# Patient Record
Sex: Female | Born: 1975 | Race: Asian | Hispanic: No | Marital: Married | Smoking: Never smoker
Health system: Southern US, Community
[De-identification: ages and names within clinical notes are randomized; demographics above are authoritative.]

---

## 2005-10-11 ENCOUNTER — Ambulatory Visit: Payer: Self-pay | Admitting: Internal Medicine

## 2006-10-29 ENCOUNTER — Inpatient Hospital Stay (HOSPITAL_COMMUNITY): Admission: AD | Admit: 2006-10-29 | Discharge: 2006-11-03 | Payer: Self-pay | Admitting: Obstetrics & Gynecology

## 2006-11-21 ENCOUNTER — Inpatient Hospital Stay (HOSPITAL_COMMUNITY): Admission: RE | Admit: 2006-11-21 | Discharge: 2006-11-24 | Payer: Self-pay | Admitting: Obstetrics and Gynecology

## 2006-11-21 ENCOUNTER — Encounter (INDEPENDENT_AMBULATORY_CARE_PROVIDER_SITE_OTHER): Payer: Self-pay | Admitting: Obstetrics and Gynecology

## 2007-06-08 ENCOUNTER — Encounter (INDEPENDENT_AMBULATORY_CARE_PROVIDER_SITE_OTHER): Payer: Self-pay | Admitting: Obstetrics and Gynecology

## 2007-06-08 ENCOUNTER — Ambulatory Visit (HOSPITAL_COMMUNITY): Admission: RE | Admit: 2007-06-08 | Discharge: 2007-06-08 | Payer: Self-pay | Admitting: Obstetrics and Gynecology

## 2007-11-30 ENCOUNTER — Ambulatory Visit: Payer: Self-pay | Admitting: Internal Medicine

## 2007-11-30 DIAGNOSIS — M546 Pain in thoracic spine: Secondary | ICD-10-CM | POA: Insufficient documentation

## 2007-12-04 ENCOUNTER — Telehealth (INDEPENDENT_AMBULATORY_CARE_PROVIDER_SITE_OTHER): Payer: Self-pay | Admitting: *Deleted

## 2008-09-23 IMAGING — US US OB COMP +14 WK
1 series · 14 of 28 positions shown · non-contrast
Comparison: none

OBSTETRICAL ULTRASOUND:

 This ultrasound exam was performed in the [HOSPITAL] Ultrasound Department.  The OB US report was generated in the AS system, and faxed to the ordering physician.  This report is also available in [REDACTED] PACS.

[Series 1: us ob comp +14 wk · 14 of 44 slices shown]
[im 2/44]
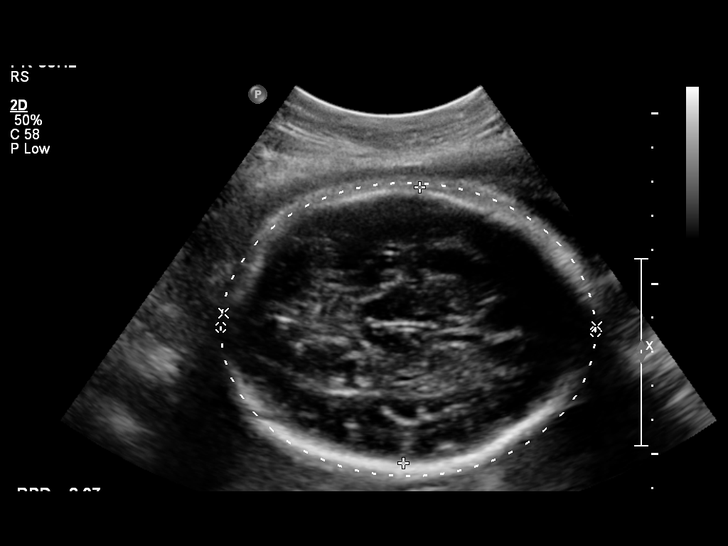
[im 5/44]
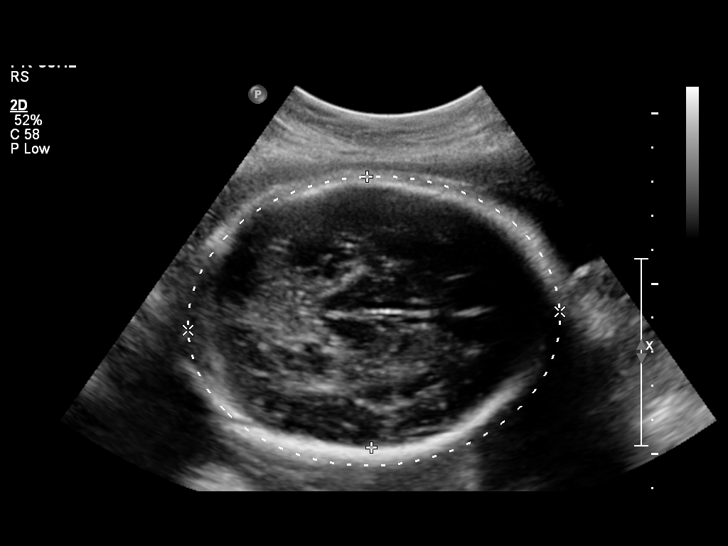
[im 8/44]
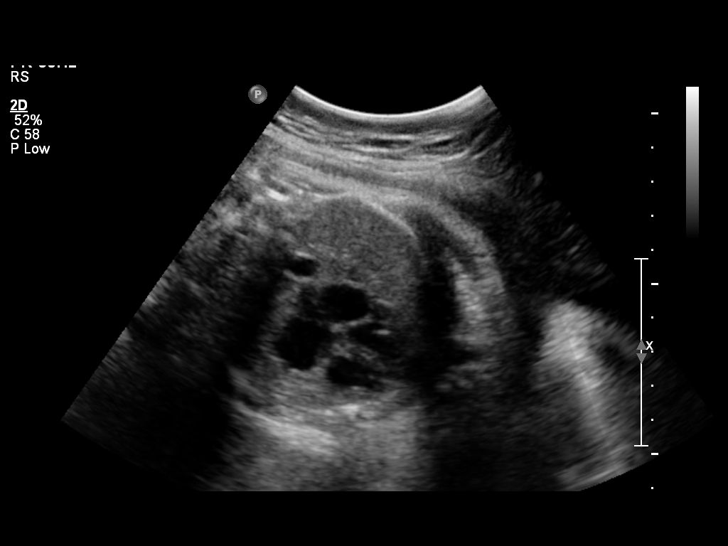
[im 12/44]
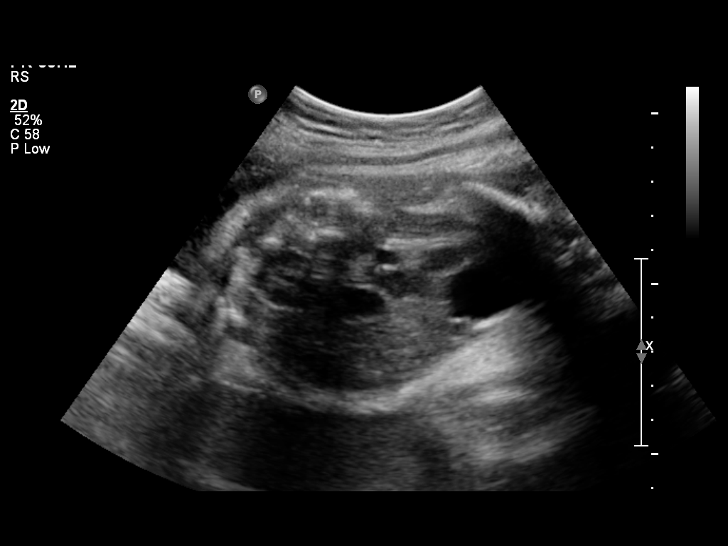
[im 15/44]
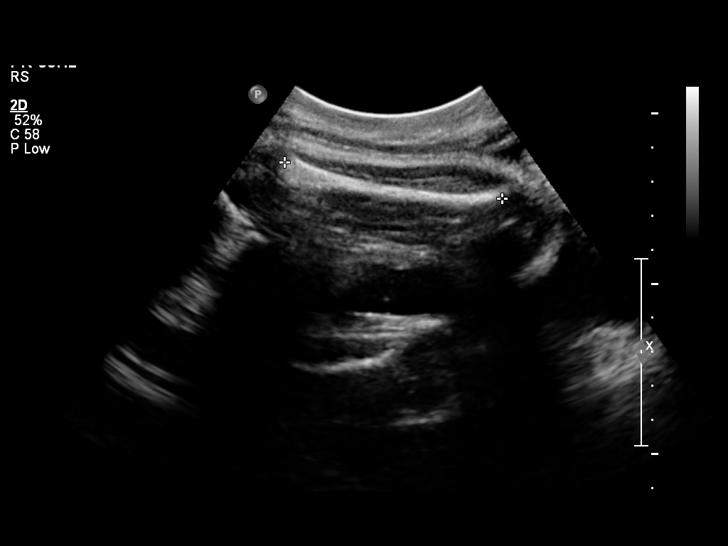
[im 18/44]
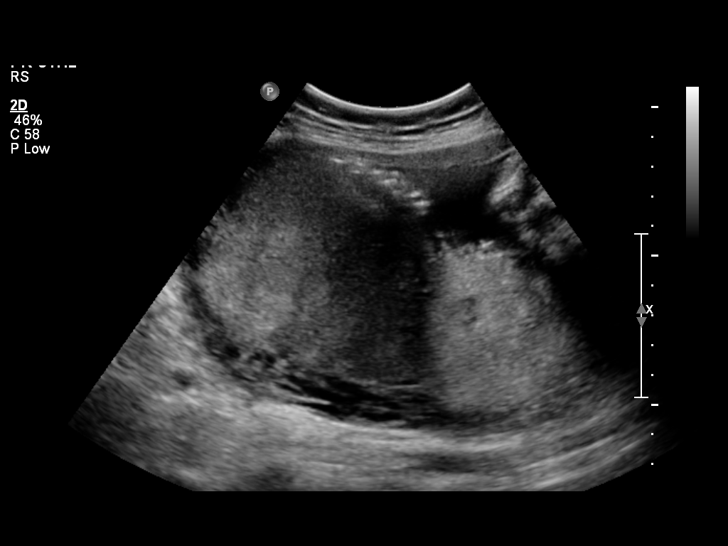
[im 21/44]
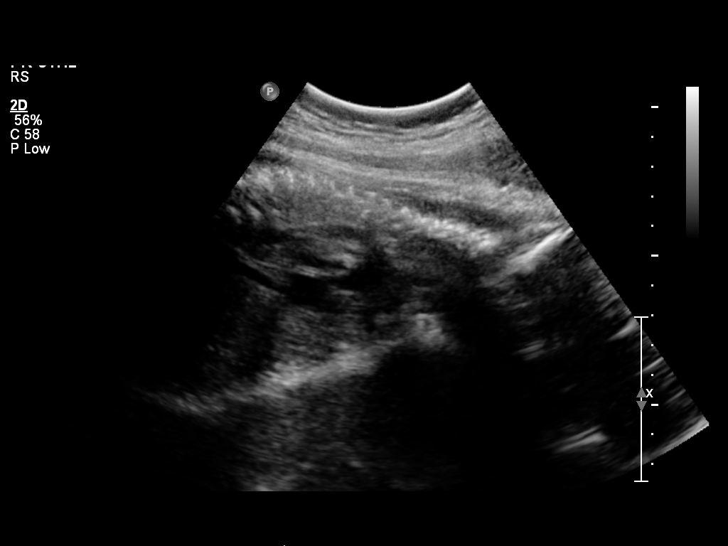
[im 24/44]
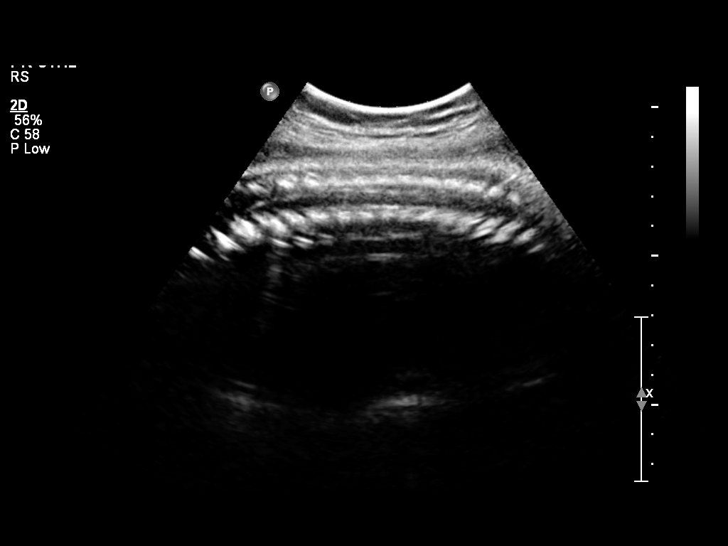
[im 28/44]
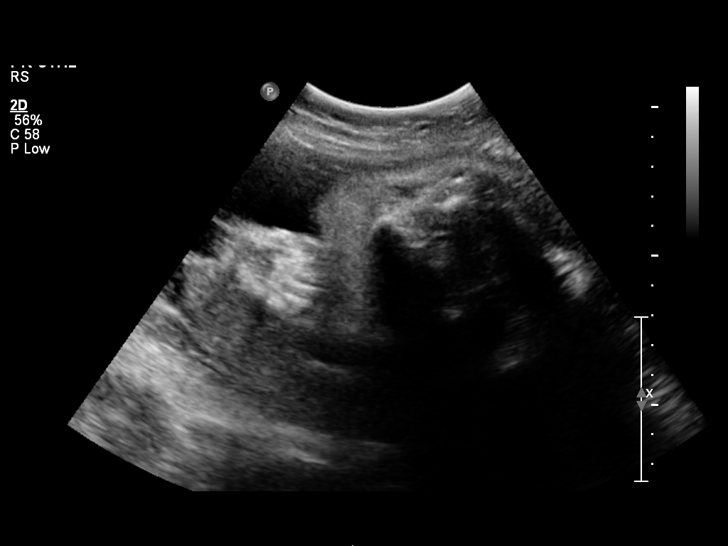
[im 31/44]
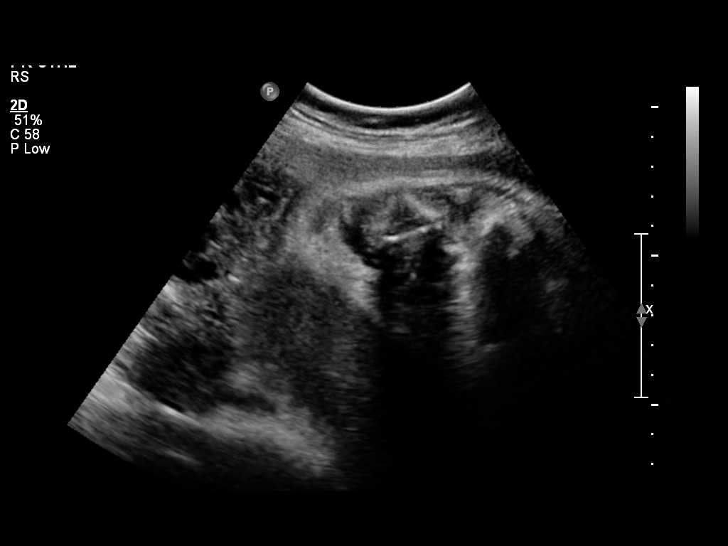
[im 34/44]
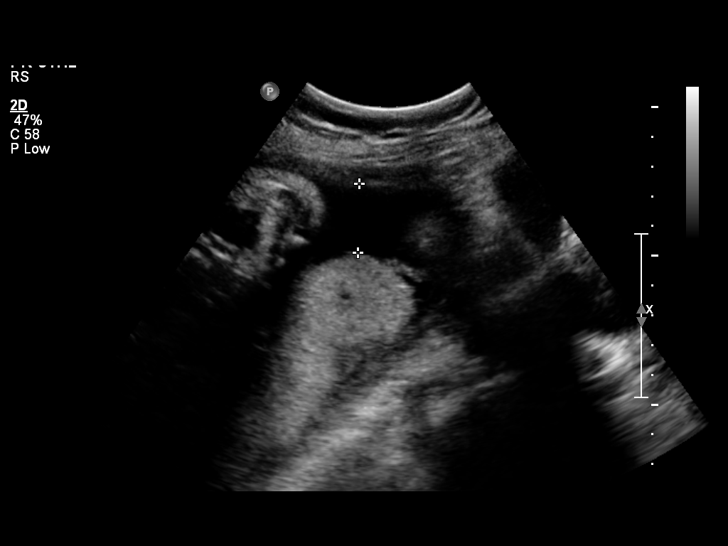
[im 37/44]
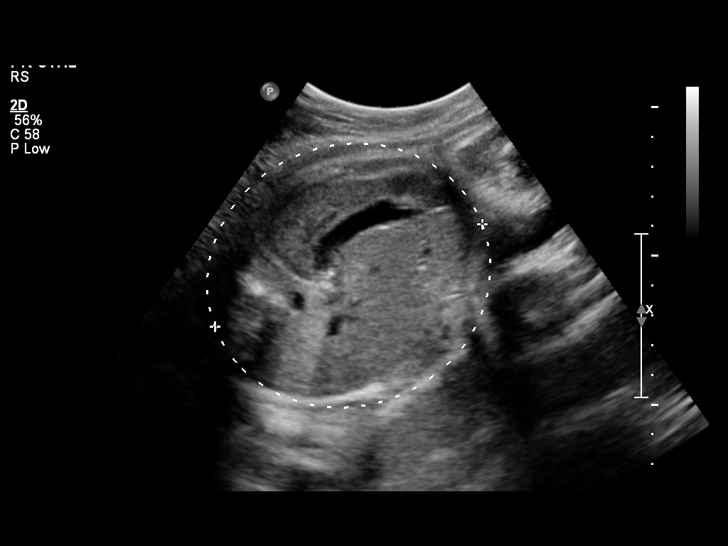
[im 40/44]
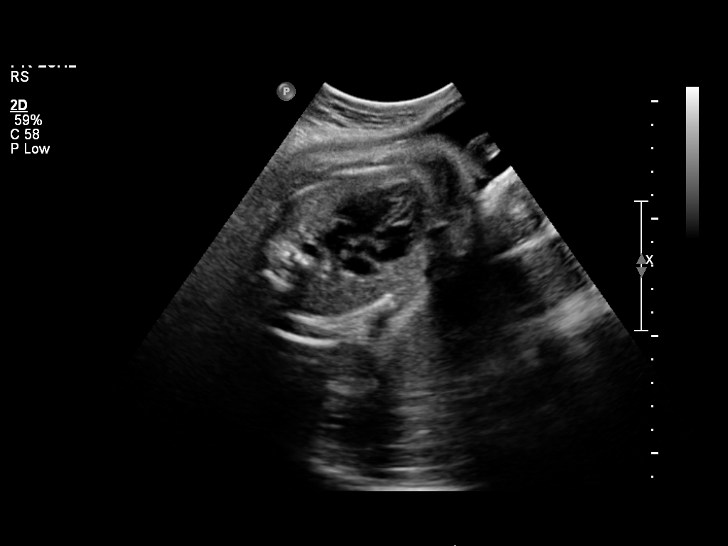
[im 44/44]
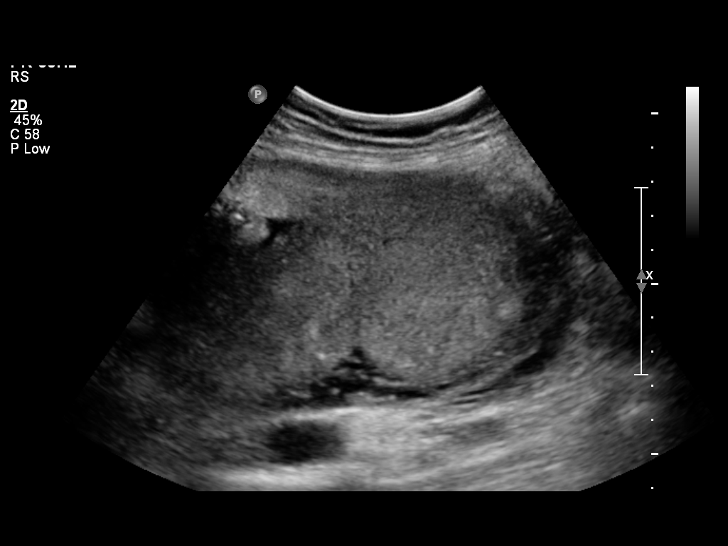

[14 of 28 positions shown; findings below may reference images not displayed]

IMPRESSION: See AS Obstetric US report.

## 2008-10-30 ENCOUNTER — Encounter: Admission: RE | Admit: 2008-10-30 | Discharge: 2008-10-30 | Payer: Self-pay | Admitting: Neurology

## 2009-06-04 ENCOUNTER — Encounter: Admission: RE | Admit: 2009-06-04 | Discharge: 2009-06-04 | Payer: Self-pay | Admitting: Internal Medicine

## 2010-08-31 NOTE — Op Note (Signed)
Julie Pineda, Julie Pineda               ACCOUNT NO.:  [id number]   MEDICAL RECORD NO.:  [id number]          PATIENT TYPE:  INP   LOCATION:  9124                          [facility]   PHYSICIAN:  Lenoard Aden, M.D.DATE OF BIRTH:  [birth year]   DATE OF PROCEDURE:  11/21/2006  DATE OF DISCHARGE:  11/03/2006                               OPERATIVE REPORT   PREOPERATIVE DIAGNOSIS:  37 week intrauterine pregnancy, third trimester  bleeding with chronic abruptio, status post betamethasone, previous  cesarean section for repeat.   POSTOPERATIVE DIAGNOSIS:  37 week intrauterine pregnancy, third  trimester bleeding with chronic abruptio, status post betamethasone,  previous cesarean section for repeat.   PROCEDURE:  Repeat low segment transverse cesarean section.   SURGEON:  Lenoard Aden, M.D.   ASSISTANT:  Maxie Better, M.D.   ANESTHESIA:  Spinal by Dr. Pamalee Leyden.   ESTIMATED BLOOD LOSS:  1000 mL.   COMPLICATIONS:  None.   DRAINS:  Foley.   COUNTS:  Correct.   DISPOSITION:  Patient to recovery in good condition.   BRIEF OPERATIVE NOTE:  After being apprised of the risks of anesthesia,  infection, bleeding, intra-abdominal injury with need for repair,  possible risks of prematurity, the patient was brought to the operating.  She was administered a spinal anesthetic without complications.  She was  prepped and draped in the usual sterile fashion.  A Foley catheter was  placed. After achieving anesthesia with dilute Marcaine solution placed,  a Pfannenstiel skin incision was made with the scalpel and carried down  to the fascia which was nicked in the midline using Mayo scissors.  The  rectus muscle was dissected sharply in the midline.  The peritoneum was  entered sharply and bladder blade placed.  The visceral peritoneum was  scored sharply off the uterine segment.  A Kerr hysterotomy incision  made.  Atraumatic delivery full term living female, handed to  pediatricians in attendance, Apgars 8/9.  Cord blood collected.  Placenta delivered manually intact from a posterior location.  No  evidence of placental abruption noted.  Good hemostasis was noted.  The  uterus was curetted using a dry lap pack and closed in two running  imbricating layers of 0 Monocryl suture.  The bladder flap was inspected  and found to be hemostatic.  Irrigation accomplished.  Good hemostasis achieved.  The fascia was then closed using a 0 Monocryl  in a continuous running fashion.  The skin was closed using staples.  The patient tolerated the procedure well and was transferred to the  recovery room in good condition.      Lenoard Aden, M.D.  Electronically Signed     RJT/MEDQ  D:  11/21/2006  T:  11/21/2006  Job:  [id number]

## 2010-08-31 NOTE — Op Note (Signed)
Julie Pineda, Julie Pineda               ACCOUNT NO.:  [id number]   MEDICAL RECORD NO.:  [id number]          PATIENT TYPE:  AMB   LOCATION:  SDC                           [facility]   PHYSICIAN:  Lenoard Aden, M.D.DATE OF BIRTH:  11-11-1975   DATE OF PROCEDURE:  06/08/2007  DATE OF DISCHARGE:                               OPERATIVE REPORT   PREOPERATIVE DIAGNOSIS:  7 weeks for elective AB.   POSTOPERATIVE DIAGNOSIS:  7 weeks for elective AB.   PROCEDURE:  Suction dilatation and evacuation.   SURGEON:  Lenoard Aden, M.D.   ANESTHESIA:  MAC and paracervical.   ESTIMATED BLOOD LOSS:  50 mL.   COMPLICATIONS:  None.   DRAINS:  None.   COUNTS:  Correct.   DISPOSITION:  Patient to recovery in good condition.   BRIEF OPERATIVE NOTE:  After being apprised of the risks of anesthesia,  bleeding, injury of abdominal organs, need for repair, delayed versus  immediate complications to include bowel and bladder, the patient was  brought to the operating room.  She was administered IV sedation without  difficulty, prepped and draped in the usual sterile fashion, and  catheterized until the bladder was empty.  The uterus is mid to  retroflexed and is easily dilated up to a #23 Pratt dilator.  A 7 mm  suction curette was placed after placement of a dilute Xylocaine  solution paracervical block, 20 mL total, in a standard fashion.  At  this time, aspiration is initiated and products of conception are noted  in the tubing. Repeat suction and curettage in a four quadrant method  revealed the cavity to be empty.  Good hemostasis was noted.  All  instruments were removed.  The patient tolerated the procedure well and  is transferred to the recovery room in good condition.      Lenoard Aden, M.D.  Electronically Signed     RJT/MEDQ  D:  06/08/2007  T:  06/09/2007  Job:  [id number]

## 2010-08-31 NOTE — H&P (Signed)
Julie Pineda, TEST               ACCOUNT NO.:  [id number]   MEDICAL RECORD NO.:  [id number]          PATIENT TYPE:  INP   LOCATION:  9155                          [facility]   PHYSICIAN:  Lenoard Aden, M.D.DATE OF BIRTH:  1975/06/18   DATE OF ADMISSION:  10/29/2006  DATE OF DISCHARGE:                              HISTORY & PHYSICAL   CHIEF COMPLAINT:  Bleeding.   HISTORY OF PRESENT ILLNESS:  She is a 35 year old Asian female, G3, P1,  history of previous cesarean section, who presents at 24 and 2/7ths  weeks gestation with sudden onset of bleeding today.   ALLERGIES:  No known drug allergies.   MEDICATIONS:  Prenatal vitamins.   PAST MEDICAL HISTORY:  1. History of cesarean section in 2000.  2. History of D&C for SAB in 2005.  3. She has a previous history of migraine headaches.   SOCIAL HISTORY:  She is a nonsmoker, nondrinker.  She denies domestic or  physical violence.   PREGNANCY HISTORY:  Remarkable for a cesarean section as noted at 41  weeks for fetal distress and a D&C for a missed AB in 2005.  Pregnancy  course has been uncomplicated to date with normal antepartum  surveillance.  Abnormal 1-hour glucose tolerance test with a normal 3-  hour glucose tolerance test.  She had a normal ultrasound in April at 20  weeks, consistent with a posterior placenta and no previa.   PHYSICAL EXAMINATION:  VITAL SIGNS:  Stable.  The patient is afebrile.  HEENT:  Normal.  LUNGS:  Clear.  HEART:  Regular rate and rhythm.  ABDOMEN:  Soft, gravid and nontender.  No CVA tenderness.  NEUROLOGIC:  Nonfocal.  SKIN:  Intact.  EXTREMITIES:  No cords.  PELVIC:  Speculum exam reveals the cervix to be visually long and  closed.  There is about 5-10 cc of dark clouded blood in the posterior  fornix.  There is no active cervical bleeding.  Digital exam reveals the  cervix to soft, closed and long.  Presenting part is ballotable.  No  evidence of rupture of membranes.   NST  reveals no evidence of contractions.  Fetal heart rate tracing in  the 140-150 beat per minute range and reactive.  Ultrasound is pending.  CBC is pending.  DIC panel is pending.   IMPRESSION:  1. A 33 and 2/7ths week intrauterine pregnancy.  2. Third trimester bleeding of unknown etiology.  Rule out partial      abruption.  3. A previous cesarean section for repeat.   PLAN:  Proceed with ultrasound, check CBC, check DIC panel.  Continuous  monitoring.  Betamethasone now and repeat in 24 hours.      Lenoard Aden, M.D.  Electronically Signed     RJT/MEDQ  D:  10/29/2006  T:  10/30/2006  Job:  [id number]

## 2010-08-31 NOTE — Discharge Summary (Signed)
NAME:  Julie Pineda, Julie Pineda               ACCOUNT NO.:  [id number]   MEDICAL RECORD NO.:  [id number]          PATIENT TYPE:  AMB   LOCATION:  SDC                           [facility]   PHYSICIAN:  Lenoard Aden, M.D.DATE OF BIRTH:  [birth year]   DATE OF ADMISSION:  06/08/2007  DATE OF DISCHARGE:                               DISCHARGE SUMMARY   CHIEF COMPLAINT:  Unwanted pregnancy for elective termination of  pregnancy at 7 weeks' gestation.   She is a 35 year old Asian female G3, P2, history of previous C-section  x2, for elective termination of pregnancy.   She has an allergy to PENICILLIN.   FAMILY HISTORY:  Hypertension, liver cancer.   PERSONAL HISTORY:  C-section x2.   She is a nonsmoker, nondrinker.  She denies domestic or physical  violence.   MEDICATIONS:  Prenatal vitamins.   PHYSICAL EXAMINATION:  GENERAL:  She is a well-developed, well-  nourished, Asian female in no acute distress.  HEENT:  Normal.  LUNGS:  Clear.  HEART:  Regular rhythm.  ABDOMEN:  Soft, nontender.  PELVIC:  Uterus is 6-8 weeks' size, anteflexed.  No adnexal masses.  EXTREMITIES:  Reveal no cords.  NEUROLOGIC:  Nonfocal.  SKIN:  Intact.   IMPRESSION:  A 7-week intrauterine pregnancy for elective termination.   PLAN:  Proceed with suction D&E.  Risks of anesthesia, infection,  bleeding, injury to abdominal organs with need for repair, uterine  perforation possible need for repair, possibility of post abortion  regret discussed.  The patient acknowledges and wishes to proceed.      Lenoard Aden, M.D.  Electronically Signed     RJT/MEDQ  D:  06/07/2007  T:  06/07/2007  Job:  [id number]

## 2010-09-03 NOTE — Discharge Summary (Signed)
Julie Pineda, Julie Pineda               ACCOUNT NO.:  [id number]   MEDICAL RECORD NO.:  [id number]          PATIENT TYPE:  INP   LOCATION:  9155                          [facility]   PHYSICIAN:  Lenoard Aden, M.D.DATE OF BIRTH:  [birth year]   DATE OF ADMISSION:  10/29/2006  DATE OF DISCHARGE:  11/03/2006                               DISCHARGE SUMMARY   HISTORY:  The patient was admitted at 33 weeks for bleeding.   HOSPITAL COURSE:  She was observed.  The bleeding has resolved.  She was  given betamethasone.  The fetal heart rate tracing was reassuring.   DISPOSITION:  She was discharged home on hospital day number five.   FOLLOWUP:  To follow up in the office within one week.   DISCHARGE DIAGNOSIS:  Chronic abruption, stable.      Lenoard Aden, M.D.  Electronically Signed     RJT/MEDQ  D:  02/14/2007  T:  02/14/2007  Job:  [id number]

## 2010-09-03 NOTE — Discharge Summary (Signed)
NAMEJERRYE, SEEBECK               ACCOUNT NO.:  [id number]   MEDICAL RECORD NO.:  [id number]          PATIENT TYPE:  INP   LOCATION:  9124                          [facility]   PHYSICIAN:  Lenoard Aden, M.D.DATE OF BIRTH:  [birth year]   DATE OF ADMISSION:  11/21/2006  DATE OF DISCHARGE:  11/24/2006                               DISCHARGE SUMMARY   HOSPITAL COURSE:  The patient underwent uncomplicated repeat low segment  transverse Cesarean section 11/21/06.   Postoperative course uncomplicated.   Discharge teaching done.   FOLLOWUP:  In the office in 4-6 weeks. Tylox, prenatal vitamins and iron  given.      Lenoard Aden, M.D.  Electronically Signed     RJT/MEDQ  D:  12/20/2006  T:  12/21/2006  Job:  [id number]

## 2011-01-07 LAB — CBC
Hemoglobin: 13.7
MCV: 93.5
RBC: 4.15
RDW: 12.4
WBC: 6.9

## 2011-01-31 LAB — CBC
HCT: 30.1 — ABNORMAL LOW
MCHC: 34.2
MCV: 96.8
Platelets: 174
RBC: 3.12 — ABNORMAL LOW
RDW: 12.8
WBC: 7.6
WBC: 8.7

## 2011-01-31 LAB — RPR: RPR Ser Ql: NONREACTIVE

## 2011-02-01 LAB — SAMPLE TO BLOOD BANK

## 2011-02-01 LAB — CULTURE, BETA STREP (GROUP B ONLY)

## 2011-02-01 LAB — DIC (DISSEMINATED INTRAVASCULAR COAGULATION)PANEL
Fibrinogen: 442
Platelets: 214
Smear Review: NONE SEEN

## 2011-02-01 LAB — CBC
Hemoglobin: 11.1 — ABNORMAL LOW
Platelets: 198
RBC: 3.36 — ABNORMAL LOW

## 2018-05-02 ENCOUNTER — Ambulatory Visit
Admission: RE | Admit: 2018-05-02 | Discharge: 2018-05-02 | Disposition: A | Discharge status: Home / Self Care | Payer: BC Managed Care – PPO | Source: Ambulatory Visit | Attending: Internal Medicine | Admitting: Internal Medicine

## 2018-05-02 ENCOUNTER — Other Ambulatory Visit: Payer: Self-pay | Admitting: Internal Medicine

## 2018-05-02 DIAGNOSIS — M542 Cervicalgia: Secondary | ICD-10-CM

## 2018-05-17 ENCOUNTER — Encounter: Payer: Self-pay | Admitting: Physical Therapy

## 2018-05-17 ENCOUNTER — Ambulatory Visit: Payer: BC Managed Care – PPO | Attending: Internal Medicine | Admitting: Physical Therapy

## 2018-05-17 DIAGNOSIS — M542 Cervicalgia: Secondary | ICD-10-CM | POA: Insufficient documentation

## 2018-05-17 NOTE — Therapy (Addendum)
[place], [place], [address] Phone: [phone]   Fax:  [phone]  Physical Therapy Evaluation/Discharge Addendum  Patient Details  Name: Julie Pineda MRN: [id number] Date of Birth: June 14, 1975 Referring Provider (PT): Lavone Orn, MD   Encounter Date: 05/17/2018  PT End of Session - 05/17/18 1436    Visit Number  1    Number of Visits  12    Date for PT Re-Evaluation  06/28/18    Authorization Type  [employer]    PT Start Time  1338    PT Stop Time  1420    PT Time Calculation (min)  42 min    Activity Tolerance  Patient tolerated treatment well    Behavior During Therapy  [facility] for tasks assessed/performed       History reviewed. No pertinent past medical history.  History reviewed. No pertinent surgical history.  There were no vitals filed for this visit.   Subjective Assessment - 05/17/18 1423    Subjective  Neck pain for a long time that has become worse on Rt over the last 3-4 months and is now causing headaches (almost daily). 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 She denies any radiculopathy, denies taking any meds, denies any significant Past medical history    Pertinent History  PMH: none    How long can you sit comfortably?  one to two hours then stands up due to discomfort    Diagnostic tests  neck XR: IMPRESSION:Minimal degenerative changes at C5-C6 and C6-C7    Patient Stated Goals  get my neck better and less headaches    Currently in Pain?  Yes    Pain Score  7     Pain Location  Neck    Pain Orientation  Right    Pain Descriptors / Indicators  Dull;Aching    Pain Type  Chronic pain    Pain Radiating Towards  denies    Pain Onset  More than a month ago    Pain Frequency  Constant    Aggravating Factors   moving head, driving, computer work    Pain Relieving Factors  sometimes takes what sounds like NSAID, has not found  anything else    Effect of Pain on Daily Activities  limits work and driving    Multiple Pain Sites  No         [facility] PT Assessment - 05/17/18 0001      Assessment   Medical Diagnosis  Chronic neck pain    Referring Provider (PT)  Lavone Orn, MD    Onset Date/Surgical Date  --   chronic pain but worse over last 3 months   Hand Dominance  Right    Next MD Visit  one year    Prior Therapy  no      Precautions   Precautions  None      Restrictions   Weight Bearing Restrictions  No      Balance Screen   Has the patient fallen in the past 6 months  No      [place] residence      Prior Function   Level of Independence  Independent      Cognition   Overall Cognitive Status  Within Functional Limits for tasks assessed      Observation/Other Assessments   Focus on Therapeutic Outcomes (FOTO)   44%limited  Sensation   Light Touch  Appears Intact      Coordination   Gross Motor Movements are Fluid and Coordinated  Yes      Posture/Postural Control   Posture Comments  good sitting posture but relays she does not always sit like this and has to remind herself to      ROM / Strength   AROM / PROM / Strength  AROM;Strength;PROM      AROM   Overall AROM Comments  shoulder WNL    AROM Assessment Site  Cervical    Cervical Flexion  75%    Cervical Extension  25%   limited by pain   Cervical - Right Side Bend  50%    Cervical - Left Side Bend  25%   limited by pain   Cervical - Right Rotation  75%    Cervical - Left Rotation  75%      PROM   Overall PROM Comments  Full neck PROM, no pain reported      Strength   Overall Strength Comments  WNL UE strength grossly bilat      Flexibility   Soft Tissue Assessment /Muscle Length  --   tight cerv P.S, levator, UT on Rt     Palpation   Spinal mobility  good mobility with PAM testing except pain and stiffness with C3-4 today    Palpation comment  TTP with trigger points and  spasm in  Rt cervical P.S, UT, levator, no TTP reported on Lt side      Special Tests   Other special tests  neg spurlings test, relays cervical distraction "feels good"      Transfers   Transfers  Independent with all Transfers      Ambulation/Gait   Gait Comments  WFL                Objective measurements completed on examination: See above findings.      [facility] Adult PT Treatment/Exercise - 05/17/18 0001      Modalities   Modalities  Electrical Stimulation;Moist Heat      Moist Heat Therapy   Number Minutes Moist Heat  10 Minutes    Moist Heat Location  Cervical      Electrical Stimulation   Electrical Stimulation Location  neck    Electrical Stimulation Action  IFC    Electrical Stimulation Parameters  tolerance, pt sitting    Electrical Stimulation Goals  Tone;Pain             PT Education - 05/17/18 1435    Education Details  HEP, POC, TENS,     Person(s) Educated  Patient    Methods  Explanation;Demonstration;Verbal cues;Handout    Comprehension  Verbalized understanding;Need further instruction          PT Long Term Goals - 05/17/18 1445      PT LONG TERM GOAL #1   Title  Pt will be I and compliant with HEP. Target for all goals 06/28/18    Time  6    Period  Weeks    Status  New      PT LONG TERM GOAL #2   Title  Pt will improve FOTO to less than 33% limited.     Time  6    Period  Weeks    Status  New      PT LONG TERM GOAL #3   Title  Pt will improve neck AROM to WNL.     Time  6    Period  Weeks    Status  New      PT LONG TERM GOAL #4   Title  Pt will be able to work her normal duties at computer and drive and perform usual ADL's all with less than 2-3/10 pain.     Time  6    Period  Weeks    Status  New      PT LONG TERM GOAL #5   Title  Pt will report no more than 1-2 headaches per week.     Time  6    Period  Weeks    Status  New             Plan - 05/17/18 1437    Clinical Impression Statement  Pt  presents with chronic neck pain, strain/spasm, and DDD that is getting worse over the last 3 months and now with daily headaches. Pain is exacerbated with neck ROM, driving, and prolonged computer work. She has decreased ROM, increased soft tissue restricitions, spasm, and trigger points, decreased spinal mobilty, and incrased pain limiting her funciton. She will benefit from skilled PT to address her defecits. She had good return from manual distraction and MHP with TENS today showing inc ROM and dec pain after.     History and Personal Factors relevant to plan of care:  none    Clinical Presentation  Evolving    Clinical Presentation due to:  worsening pain and headaches over the last 3 months    Clinical Decision Making  Moderate    Rehab Potential  Good    Clinical Impairments Affecting Rehab Potential  long hours working at computer    PT Frequency  Other (comment)   1-2   PT Duration  6 weeks    PT Treatment/Interventions  Cryotherapy;Electrical Stimulation;Iontophoresis 53m/ml Dexamethasone;Moist Heat;Traction;Ultrasound;Therapeutic activities;Therapeutic exercise;Neuromuscular re-education;Manual techniques;Passive range of motion;Dry needling;Spinal Manipulations;Joint Manipulations;Taping    PT Next Visit Plan  review HEP, may benefit from traction, DN, STM, spinal mobs, modlaties    PT Home Exercise Plan  [email or web address]    Consulted and Agree with Plan of Care  Patient       Patient will benefit from skilled therapeutic intervention in order to improve the following deficits and impairments:  Decreased activity tolerance, Decreased range of motion, Hypomobility, Impaired flexibility, Increased muscle spasms, Increased fascial restricitons, Pain  Visit Diagnosis: Cervicalgia     Problem List Patient Active Problem List   Diagnosis Date Noted  . BACK PAIN, THORACIC REGION 11/30/2007    BSilvestre Mesi1/30/2020, 2:48 PM  PHYSICAL THERAPY DISCHARGE SUMMARY  Visits from  Start of Care: 1  Current functional level related to goals / functional outcomes: See above   Remaining deficits: See above  Education / Equipment: HEP Plan: Patient agrees to discharge.  Patient goals were not met. Patient is being discharged due to not returning since the last visit.  ?????    BElsie Ra PT, DPT 07/09/18 11:46 AM   [facility] [address]Phone: [phone]  Fax:  [phone] Name: JLoletta HarperMRN: [id number] of Birth: [birth year]

## 2018-05-17 NOTE — Patient Instructions (Addendum)
Access Code: [email or web address]  URL: [email or web address]  Date: 05/17/2018  Prepared by: Ivery QualeBrian Glory Graefe   Exercises  Seated Levator Scapulae Stretch - 3 sets - 30 hold - 2x daily - 6x weekly  Seated Cervical Sidebending Stretch - 3 sets - 30 hold - 2x daily - 6x weekly  Seated Cervical Retraction - 10 reps - 3 sets - 2x daily - 6x weekly  Cervical Extension AROM with Strap - 10 reps - 1-2 sets - 5 hold - 2x daily - 6x weekly  Seated Assisted Cervical Rotation with Towel - 10 reps - 1-2 sets - 5 hold - 2x daily - 6x weekly  Seated Scapular Retraction - 10 reps - 2-3 sets - 5 sec hold - 2x daily - 6x weekly   TENS UNIT: This is helpful for muscle pain and spasm.   Search and Purchase a TENS 7000 2nd edition at www.tenspros.com. It should be less than $30.     TENS unit instructions: Do not shower or bathe with the unit on Turn the unit off before removing electrodes or batteries If the electrodes lose stickiness add a drop of water to the electrodes after they are disconnected from the unit and place on plastic sheet. If you continued to have difficulty, call the TENS unit company to purchase more electrodes. Do not apply lotion on the skin area prior to use. Make sure the skin is clean and dry as this will help prolong the life of the electrodes. After use, always check skin for unusual red areas, rash or other skin difficulties. If there are any skin problems, does not apply electrodes to the same area. Never remove the electrodes from the unit by pulling the wires. Do not use the TENS unit or electrodes other than as directed. Do not change electrode placement without consultating your therapist or physician. Keep 2 fingers with between each electrode. Wear time ratio is 2:1, on to off times.    For example on for 30 minutes off for 15 minutes and then on for 30 minutes off for 15 minutes

## 2018-05-24 ENCOUNTER — Encounter: Payer: BC Managed Care – PPO | Admitting: Physical Therapy

## 2018-05-31 ENCOUNTER — Encounter: Payer: BC Managed Care – PPO | Admitting: Physical Therapy

## 2020-03-27 IMAGING — CR DG CERVICAL SPINE 2 OR 3 VIEWS
3 series · 3 of 3 positions shown · non-contrast
Comparison: None.

CLINICAL DATA: Posterior neck pain radiating up into the head for
the past 3-4 months.

EXAM:
CERVICAL SPINE - 2-3 VIEW

[w c-spine lat]
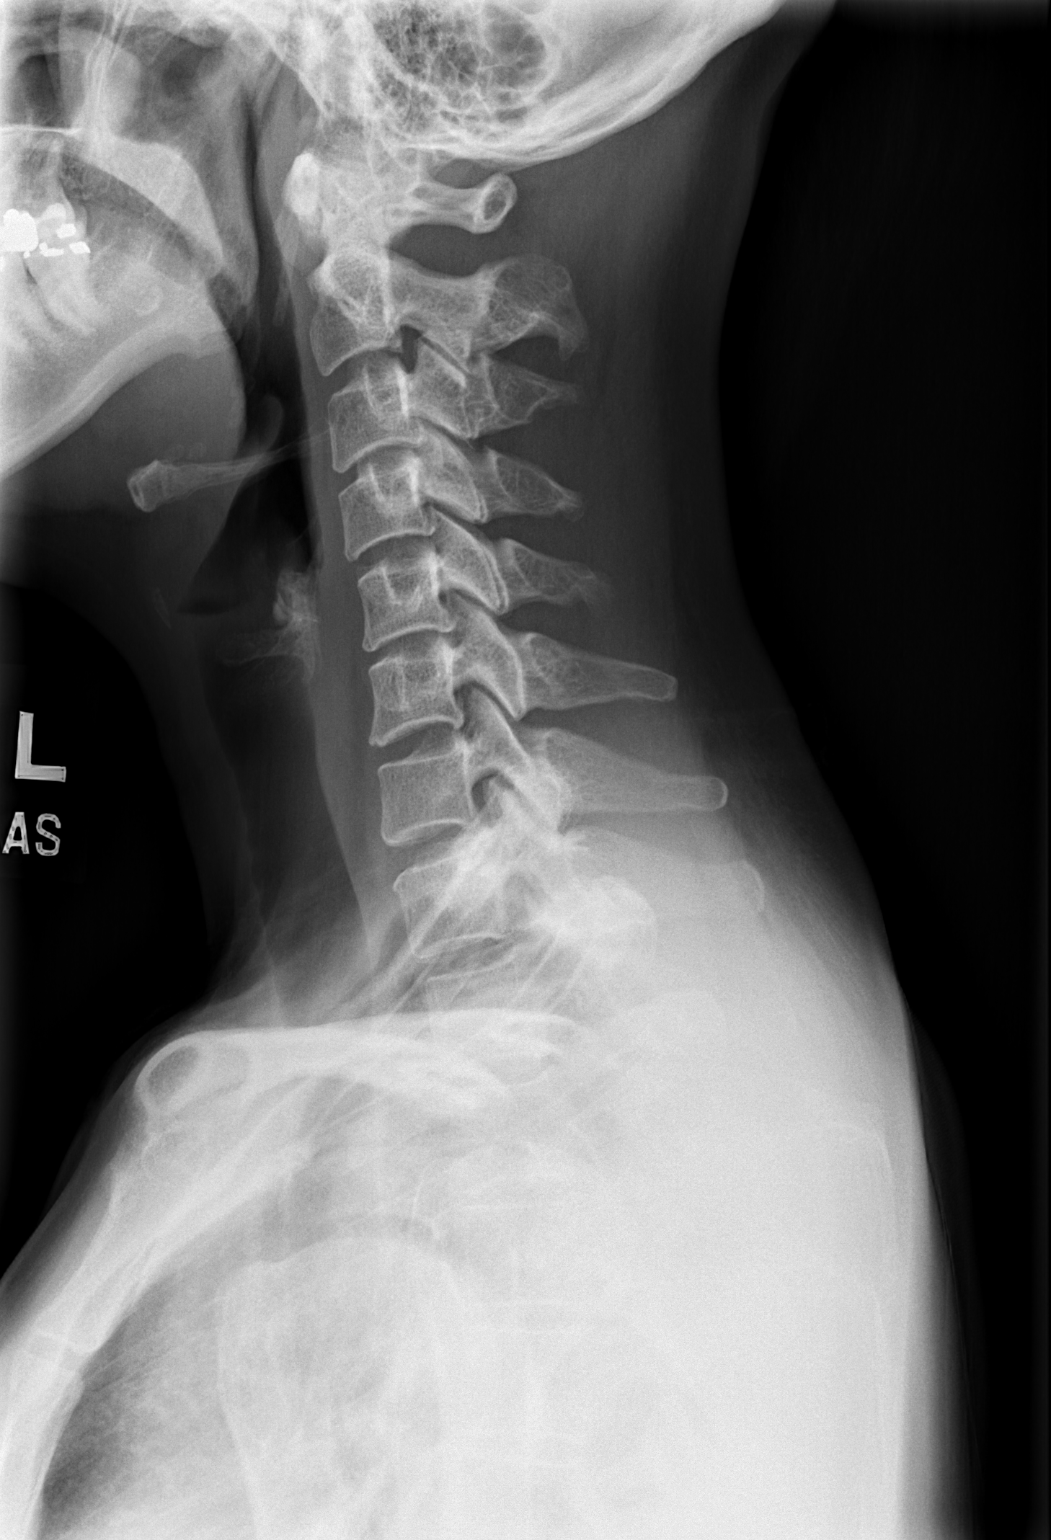

[w c-spine a.p. *]
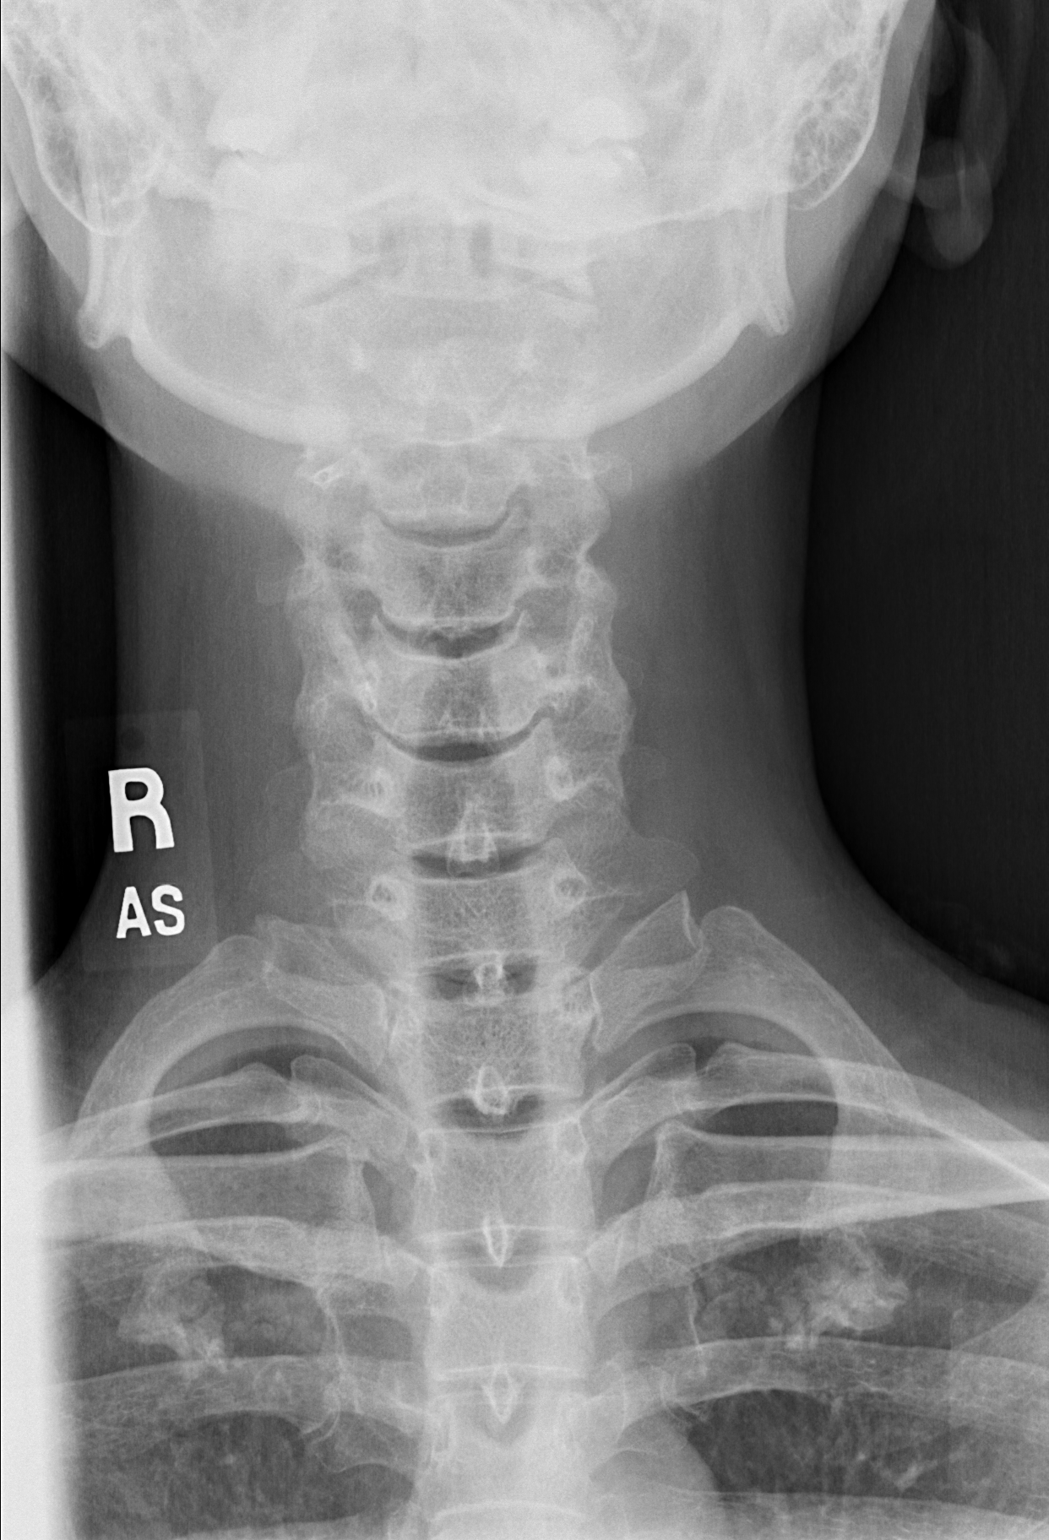

[w c-spine odontoid *]
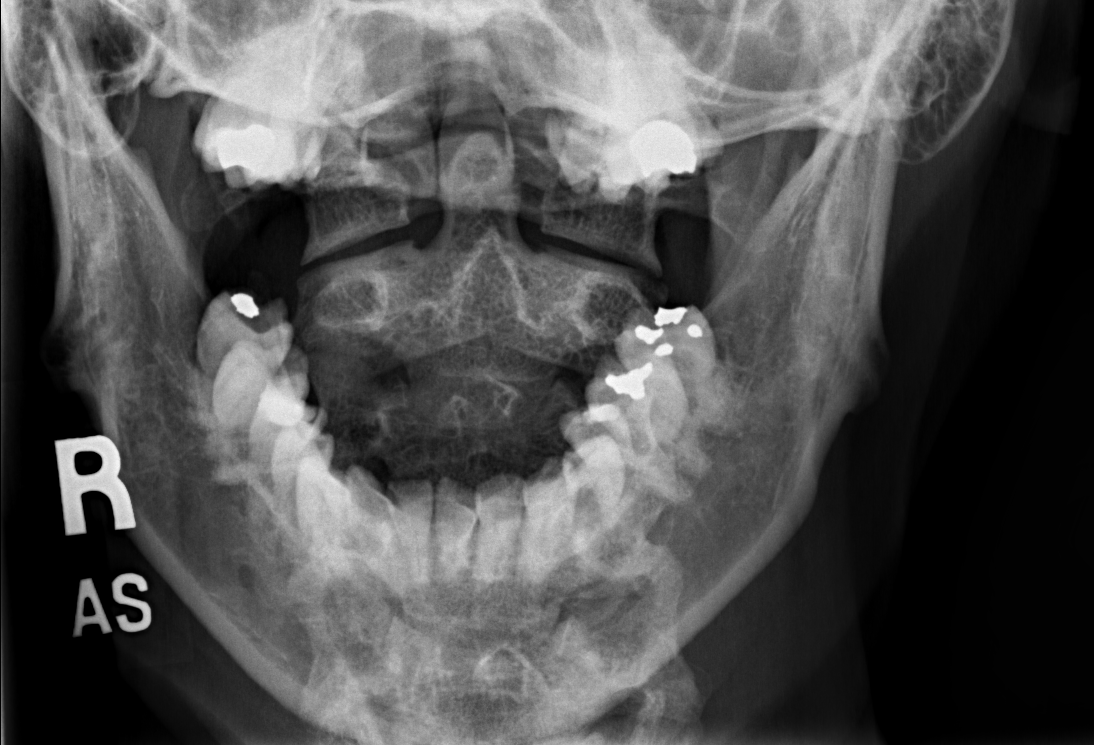

[3 of 3 positions shown; findings below may reference images not displayed]

FINDINGS: The lateral view is diagnostic to the T2 level. There is no acute
fracture or subluxation. Vertebral body heights are preserved.
Alignment is normal. Interveterbral disc spaces are maintained.
Minimal uncovertebral hypertrophy at C5-C6 and C6-C7.Normal
prevertebral soft tissues.
IMPRESSION: 1. Minimal degenerative changes at C5-C6 and C6-C7.

## 2020-12-10 ENCOUNTER — Telehealth: Payer: Self-pay | Admitting: *Deleted

## 2020-12-10 NOTE — Telephone Encounter (Signed)
Per referral - called and gave upcoming appointments - confirmed - mailed welcome packet with referral

## 2021-01-21 ENCOUNTER — Inpatient Hospital Stay (HOSPITAL_BASED_OUTPATIENT_CLINIC_OR_DEPARTMENT_OTHER): Payer: BC Managed Care – PPO | Admitting: Hematology & Oncology

## 2021-01-21 ENCOUNTER — Other Ambulatory Visit: Payer: Self-pay

## 2021-01-21 ENCOUNTER — Inpatient Hospital Stay: Payer: BC Managed Care – PPO | Attending: Hematology & Oncology

## 2021-01-21 ENCOUNTER — Encounter: Payer: Self-pay | Admitting: Hematology & Oncology

## 2021-01-21 VITALS — BP 105/83 | HR 77 | Temp 98.0°F | Resp 18 | Ht 65.0 in | Wt 134.0 lb

## 2021-01-21 DIAGNOSIS — Z8616 Personal history of COVID-19: Secondary | ICD-10-CM

## 2021-01-21 DIAGNOSIS — Z880 Allergy status to penicillin: Secondary | ICD-10-CM | POA: Diagnosis not present

## 2021-01-21 DIAGNOSIS — Z79899 Other long term (current) drug therapy: Secondary | ICD-10-CM | POA: Diagnosis not present

## 2021-01-21 DIAGNOSIS — D72819 Decreased white blood cell count, unspecified: Secondary | ICD-10-CM

## 2021-01-21 LAB — CBC WITH DIFFERENTIAL (CANCER CENTER ONLY)
Abs Immature Granulocytes: 0.02 10*3/uL (ref 0.00–0.07)
Basophils Absolute: 0 10*3/uL (ref 0.0–0.1)
Basophils Relative: 0 %
Eosinophils Absolute: 0 10*3/uL (ref 0.0–0.5)
Eosinophils Relative: 0 %
HCT: 37.8 % (ref 36.0–46.0)
Hemoglobin: 12.8 g/dL (ref 12.0–15.0)
Immature Granulocytes: 0 %
Lymphocytes Relative: 30 %
Lymphs Abs: 1.6 10*3/uL (ref 0.7–4.0)
MCH: 32.7 pg (ref 26.0–34.0)
MCHC: 33.9 g/dL (ref 30.0–36.0)
MCV: 96.4 fL (ref 80.0–100.0)
Monocytes Absolute: 0.3 10*3/uL (ref 0.1–1.0)
Monocytes Relative: 7 %
Neutro Abs: 3.2 10*3/uL (ref 1.7–7.7)
Neutrophils Relative %: 63 %
Platelet Count: 215 10*3/uL (ref 150–400)
RBC: 3.92 MIL/uL (ref 3.87–5.11)
RDW: 11.9 % (ref 11.5–15.5)
WBC Count: 5.1 10*3/uL (ref 4.0–10.5)
nRBC: 0 % (ref 0.0–0.2)

## 2021-01-21 LAB — SAVE SMEAR(SSMR), FOR PROVIDER SLIDE REVIEW

## 2021-01-21 NOTE — Progress Notes (Signed)
Referral MD  Reason for Referral: Transient leukopenia  Chief Complaint  Patient presents with   New Patient (Initial Visit)  : My white cells were low.  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  She has been very healthy.  She is followed by Dr. Lavone Orn.  She  She had COVID I think on New Year's Eve last year.  She had symptoms for a few days.  She got it from her daughter.  She has been found to have some transient leukopenia.  Dr. Laurann Montana has been checking her blood count.  She most recently was found to have a white cell count of 2.8.  She had a normal white cell differential.  She is never had any problems with her red cells or platelets.  She has had no weight loss or weight gain.  She is not a vegetarian.  She has had no swollen lymph nodes.  There is no change in bowel or bladder habits.  There is been no issues with cough or shortness of breath.    She does not smoke.  She is not drink alcohol.  There is been no rashes.  She has had no change in medications.  She does use occasional supplements.  She was kindly referred to the [facility] for the leukopenia.  Overall, I would say performance status is probably ECOG 0.    No past medical history on file.:  No past surgical history on file.:   Current Outpatient Medications:    Calcium Carbonate-Vit D-Min (CALCIUM 1200 PO), Take 1 tablet by mouth daily., Disp: , Rfl:    co-enzyme Q-10 30 MG capsule, Take 30 mg by mouth daily., Disp: , Rfl:    Multiple Vitamins-Minerals (MULTIVITAMIN GUMMIES ADULT PO), Take 1 tablet by mouth daily., Disp: , Rfl: :  :   Allergies  Allergen Reactions   Penicillins Other (See Comments)    Fainting  :  No family history on file.:   Social History   Socioeconomic History   Marital status: Married    Spouse name: Not on file   Number of children: Not on  file   Years of education: Not on file   Highest education level: Not on file  Occupational History   Not on file  Tobacco Use   Smoking status: Never   Smokeless tobacco: Never  Vaping Use   Vaping Use: Never used  Substance and Sexual Activity   Alcohol use: Not on file   Drug use: Not on file   Sexual activity: Not on file  Other Topics Concern   Not on file  Social History Narrative   Not on file   Social Determinants of Health   Financial Resource Strain: Not on file  Food Insecurity: Not on file  Transportation Needs: Not on file  Physical Activity: Not on file  Stress: Not on file  Social Connections: Not on file  Intimate Partner Violence: Not on file  :  Review of Systems  Constitutional: Negative.   HENT: Negative.    Eyes: Negative.   Respiratory: Negative.    Cardiovascular: Negative.   Gastrointestinal: Negative.   Genitourinary: Negative.   Musculoskeletal: Negative.   Skin: Negative.   Neurological: Negative.   Endo/Heme/Allergies: Negative.   Psychiatric/Behavioral: Negative.      Exam: Her vital signs are temperature of 98.  Pulse 77.  Blood pressure 105/83.  Weight is  134 pounds.  _0 @ Physical Exam Vitals reviewed.  HENT:     Head: Normocephalic and atraumatic.  Eyes:     Pupils: Pupils are equal, round, and reactive to light.  Cardiovascular:     Rate and Rhythm: Normal rate and regular rhythm.     Heart sounds: Normal heart sounds.  Pulmonary:     Effort: Pulmonary effort is normal.     Breath sounds: Normal breath sounds.  Abdominal:     General: Bowel sounds are normal.     Palpations: Abdomen is soft.  Musculoskeletal:        General: No tenderness or deformity. Normal range of motion.     Cervical back: Normal range of motion.  Lymphadenopathy:     Cervical: No cervical adenopathy.  Skin:    General: Skin is warm and dry.     Findings: No erythema or rash.  Neurological:     Mental Status: She is alert and oriented  to person, place, and time.  Psychiatric:        Behavior: Behavior normal.        Thought Content: Thought content normal.        Judgment: Judgment normal.      Recent Labs    01/21/21 1346  WBC 5.1  HGB 12.8  HCT 37.8  PLT 215   No results for input(s): NA, K, CL, CO2, GLUCOSE, BUN, CREATININE, CALCIUM in the last 72 hours.  Blood smear review: Normochromic and normocytic population of red blood cells.  There are no schistocytes.  Are no spherocytes.  I see no nucleated red blood cells.  There are no teardrop cells.  I see no target cells.  There is no rouleaux formation.  White blood cells appear normal in morphology and maturation.  I do not see any hypersegmented polys.  There are no immature myeloid or lymphoid forms.  Platelets are adequate in number and size.  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  She had a transient leukopenia.  I am not sure as to why she has had this.  Her white cell count is normal today.  She has a normal white cell differential.  Under the microscope, on her blood smear, everything looks fantastic.  I do not see any immature white blood cells.  I do not see any evidence of a hematologic malignancy.  I will see anything that would suggest a hematologic disorder.  She does not need to have a bone marrow biopsy done.  There is nothing on her exam that would suggest any type of issue.  I highly doubt myelodysplasia.  Again, I will know having COVID may have affected her blood count.  I know there are things that we just are not clear about with respect to COVID and hematologic parameters.  I just do not think that we have to see her back.  Again she is very nice.  She obviously is very articulate and very well educated.  I reviewed her lab work with her.  I just cannot find any obvious hematologic issue.  I had a wonderful time talking with her.  She is incredibly interesting to talk to.

## 2021-01-22 ENCOUNTER — Telehealth: Payer: Self-pay | Admitting: Hematology & Oncology

## 2021-01-22 NOTE — Telephone Encounter (Signed)
NO los 10/6 

## 2023-11-06 NOTE — Progress Notes (Signed)
 New Patient Note  RE: Julie Pineda MRN: [id number] DOB: 01-26-1976 Date of Office Visit: 11/07/2023  Consult requested by: Dwight Trula SQUIBB, MD Primary care provider: Signa Rush, MD (Inactive)  Chief Complaint: Establish Care (Patient presents to the office due to more frequent runny nose, sinus pressure and swelling, sneezing. Patient reports taking zyrtec for 3 weeks and still only had one episode. )  History of Present Illness: I had the pleasure of seeing Julie Pineda for initial evaluation at the [facility] on 11/07/2023. She is a 48 y.o. female, who is referred here by Dwight Trula SQUIBB, MD for the evaluation of allergic rhinitis.  Discussed the use of AI scribe software for clinical note transcription with the patient, who gave verbal consent to proceed.    She has been experiencing frequent allergy symptoms since the beginning of the year, including rhinorrhea, sneezing, and nasal congestion. These symptoms occur every two to three days and typically resolve after a night's sleep. They are more frequent when she feels weak or has not slept well.  She has a history of similar allergy symptoms occurring once a year, typically in the winter, but they have become more frequent this year. No headaches, sinus infections, or need for antibiotics. She has not undergone allergy testing or seen an otolaryngologist before.  She has been using over-the-counter allergy medications like Zyrtec, but they have not been very effective. She has a nasal spray but has not used it much. No asthma, inhaler use, or surgeries related to her nose.  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 She has a nine-year-old dog but did not notice any change in her symptoms when away from the dog for two weeks.  She reports a childhood allergy to penicillin, which caused an allergic reaction and fainting, leading to its discontinuation.  No fever, chills,  changes in appetite or bowel habits. She reports occasional heartburn or reflux.     She reports symptoms of rhinorrhea, nasal congestion, sneezing, PND. Symptoms have been going on for many years. The symptoms are present mainly in the winter months but this year it's been worse. Anosmia: no. Headache: no. She has used zyrtec with some improvement in symptoms. Sinus infections: no. Previous work up includes: none. Previous ENT evaluation: no. History of reflux: sometimes.  Assessment and Plan: Julie Pineda is a 48 y.o. female with: Other allergic rhinitis Worsening symptoms despite OTC antihistamine use. No prior allergy testing or ENT evaluation. 1 dog at home. Return for allergy skin testing. Will make additional recommendations based on results. If significant positives will recommend allergy injections - handout given. If negative will refer to ENT next.  Use Flonase (fluticasone) nasal spray 1-2 sprays per nostril once a day as needed for nasal congestion.  Nasal saline spray (i.e., Simply Saline) or nasal saline lavage (i.e., NeilMed) is recommended as needed and prior to medicated nasal sprays.  Return for Skin testing.  No orders of the defined types were placed in this encounter.  Lab Orders  No laboratory test(s) ordered today    Other allergy screening: Asthma: no Food allergy: no Medication allergy: penicillin - as a child had a rash and then had a fainting episode Hymenoptera allergy: no Urticaria: no Eczema:no History of recurrent infections suggestive of immunodeficency: no  Diagnostics: None.    Past Medical History: Patient Active Problem List   Diagnosis Date Noted   BACK PAIN, THORACIC REGION 11/30/2007  History reviewed. No pertinent past medical history. Past Surgical History: History reviewed. No pertinent surgical history. Medication List:  Current Outpatient Medications  Medication Sig Dispense Refill   Calcium Carbonate-Vit D-Min (CALCIUM 1200  PO) Take 1 tablet by mouth daily.     co-enzyme Q-10 30 MG capsule Take 30 mg by mouth daily.     No current facility-administered medications for this visit.   Allergies: Allergies  Allergen Reactions   Penicillins Other (See Comments)    Fainting   Social History: Social History   Socioeconomic History   Marital status: Married    Spouse name: Not on file   Number of children: Not on file   Years of education: Not on file   Highest education level: Not on file  Occupational History   Not on file  Tobacco Use   Smoking status: Never    Passive exposure: Never   Smokeless tobacco: Never  Vaping Use   Vaping status: Never Used  Substance and Sexual Activity   Alcohol use: Not on file   Drug use: Not on file   Sexual activity: Not on file  Other Topics Concern   Not on file  Social History Narrative   Not on file   Social Drivers of Health   Financial Resource Strain: Not on file  Food Insecurity: Not on file  Transportation Needs: Not on file  Physical Activity: Not on file  Stress: Not on file  Social Connections: Not on file   Lives in a house. Smoking: denies Occupation: professor  Environmental HistorySurveyor, minerals in the house: no Engineer, civil (consulting) in the family room: no Carpet in the bedroom: yes Heating: gas Cooling: central Pet: yes 1 dog x 9 yrs  Family History: History reviewed. No pertinent family history. Problem                               Relation Asthma                                   no Eczema                                no Food allergy                          no Allergic rhino conjunctivitis     no  Review of Systems  Constitutional:  Negative for appetite change, chills, fever and unexpected weight change.  HENT:  Positive for congestion, postnasal drip, rhinorrhea and sneezing.   Eyes:  Negative for itching.  Respiratory:  Negative for cough, chest tightness, shortness of breath and wheezing.   Cardiovascular:  Negative for  chest pain.  Gastrointestinal:  Negative for abdominal pain.  Genitourinary:  Negative for difficulty urinating.  Skin:  Negative for rash.  Neurological:  Negative for headaches.    Objective: BP 108/70 (BP Location: Left Arm, Patient Position: Sitting, Cuff Size: Normal)   Pulse 84   Temp 98.3 F (36.8 C) (Temporal)   Resp 18   Ht 5' 4.06 (1.627 m)   Wt 136 lb 12.8 oz (62.1 kg)   SpO2 98%   BMI 23.44 kg/m  Body mass index is 23.44 kg/m. Physical Exam Vitals and nursing note reviewed.  Constitutional:  Appearance: Normal appearance. She is well-developed.  HENT:     Head: Normocephalic and atraumatic.     Right Ear: Tympanic membrane and external ear normal.     Left Ear: Tympanic membrane and external ear normal.     Nose: Congestion and rhinorrhea present.     Mouth/Throat:     Mouth: Mucous membranes are moist.     Pharynx: Oropharynx is clear.  Eyes:     Conjunctiva/sclera: Conjunctivae normal.  Cardiovascular:     Rate and Rhythm: Normal rate and regular rhythm.     Heart sounds: Normal heart sounds. No murmur heard.    No friction rub. No gallop.  Pulmonary:     Effort: Pulmonary effort is normal.     Breath sounds: Normal breath sounds. No wheezing, rhonchi or rales.  Musculoskeletal:     Cervical back: Neck supple.  Skin:    General: Skin is warm.     Findings: No rash.  Neurological:     Mental Status: She is alert and oriented to person, place, and time.  Psychiatric:        Behavior: Behavior normal.    The plan was reviewed with the patient/family, and all questions/concerned were addressed.  It was my pleasure to see Julie Pineda today and participate in her care. Please feel free to contact me with any questions or concerns.  Sincerely,  Orlan Cramp, DO Allergy & Immunology  [facility]  [place] office: [phone] [facility] office: [phone]

## 2023-11-07 ENCOUNTER — Encounter: Payer: Self-pay | Admitting: Allergy

## 2023-11-07 ENCOUNTER — Other Ambulatory Visit: Payer: Self-pay

## 2023-11-07 ENCOUNTER — Ambulatory Visit: Payer: Self-pay | Admitting: Allergy

## 2023-11-07 VITALS — BP 108/70 | HR 84 | Temp 98.3°F | Resp 18 | Ht 64.06 in | Wt 136.8 lb

## 2023-11-07 DIAGNOSIS — J3089 Other allergic rhinitis: Secondary | ICD-10-CM

## 2023-11-07 NOTE — Patient Instructions (Addendum)
 Rhinitis  Return for allergy skin testing. Will make additional recommendations based on results. If significant positives will recommend allergy injections - handout given. If negative will refer to ENT next.  Make sure you don't take any antihistamines for 3 days before the skin testing appointment. Don't put any lotion on the back and arms on the day of testing.  Must be in good health and not ill. No vaccines/injections/antibiotics within the past 7 days.  Plan on being here for 30-60 minutes.  Use Flonase (fluticasone) nasal spray 1-2 sprays per nostril once a day as needed for nasal congestion.  Nasal saline spray (i.e., Simply Saline) or nasal saline lavage (i.e., NeilMed) is recommended as needed and prior to medicated nasal sprays.  Follow up for skin testing.

## 2023-11-13 NOTE — Progress Notes (Unsigned)
 Skin testing note  RE: Julie Pineda MRN: [id number] DOB: 22-Apr-1975 Date of Office Visit: 11/14/2023  Referring provider: No ref. provider found Primary care provider: Dwight Trula SQUIBB, MD  Chief Complaint: skin testing  History of Present Illness: I had the pleasure of seeing Julie Pineda for a skin testing visit at the [facility] on 11/14/2023. She is a 48 y.o. female, who is being followed for allergic rhinitis. Her previous allergy  office visit was on 11/07/2023 with Dr. Luke. Today is a skin testing visit.   Discussed the use of AI scribe software for clinical note transcription with the patient, who gave verbal consent to proceed.    She has been experiencing allergic symptoms, including sneezing and a tight throat, which have been occurring more frequently since January. These symptoms can last all day and are triggered by entering cold, air-conditioned buildings, such as during a recent visit to a car service center.  She has not been taking any medications for her symptoms but has several bottles of Flonase nasal spray at home, which she has not yet used.  She has a dog at home.  Her symptoms have been more frequent this year, sometimes occurring weekly or every two weeks, with the most recent episode happening yesterday and another two weeks prior while traveling in [place].     Assessment and Plan: Julie Pineda is a 48 y.o. female with: Other allergic rhinitis Past history - Worsening symptoms despite OTC antihistamine use. No prior ENT evaluation. 1 dog at home. Today's skin testing positive to grass, ragweed, weed, trees, mold, cockroach. Start environmental control measures as below. Use over the counter antihistamines such as Zyrtec (cetirizine), Claritin (loratadine), Allegra (fexofenadine), or Xyzal (levocetirizine) daily as needed. May take twice a day during allergy  flares. May switch antihistamines every few months. Use Flonase (fluticasone) nasal spray  1-2 sprays per nostril once a day as needed for nasal congestion.  Nasal saline spray (i.e., Simply Saline) or nasal saline lavage (i.e., NeilMed) is recommended as needed and prior to medicated nasal sprays. Recommend allergy  injections. 2 shots.  Had a detailed discussion with patient/family that clinical history is suggestive of allergic rhinitis, and may benefit from allergy  immunotherapy (AIT). Discussed in detail regarding the dosing, schedule, side effects (mild to moderate local allergic reaction and rarely systemic allergic reactions including anaphylaxis), and benefits (significant improvement in nasal symptoms, seasonal flares of asthma) of immunotherapy with the patient. There is significant time commitment involved with allergy  shots, which includes weekly immunotherapy injections for first 9-12 months and then biweekly to monthly injections for 3-5 years.   Return in about 3 months (around 02/14/2024).  No orders of the defined types were placed in this encounter.  Lab Orders  No laboratory test(s) ordered today    Diagnostics: Skin Testing: Environmental allergy  panel. Today's skin testing positive to grass, ragweed, weed, trees, mold, cockroach. Results discussed with patient/family.  Airborne Adult Perc - 11/14/23 1016     Time Antigen Placed 1016    Allergen Manufacturer Jestine    Location Back    Number of Test 55    1. Control-Buffer 50% Glycerol Negative    2. Control-Histamine 3+    3. Bahia Negative    4. French Southern [place] 2+    5. Johnson 2+    6. Kentucky  Blue 2+    7. Meadow Fescue Negative    8. Perennial Rye Negative    9. Timothy Negative    10. Ragweed Mix 2+  11. Cocklebur Negative    12. Plantain,  English Negative    13. Baccharis 2+    14. Dog Fennel --   +/-   15. Russian Thistle 2+    16. Lamb's Quarters 2+    17. Sheep Sorrell --   +/-   18. Rough Pigweed --   +/-   19. Marsh Elder, Rough 2+    20. Mugwort, Common Negative    21. Box, Elder 2+     22. Cedar, red 2+    23. Sweet Gum 2+    24. Pecan Pollen 3+    25. Pine Mix 3+    26. Walnut, Black Pollen 2+    27. Red Mulberry 2+    28. Ash Mix Negative    29. Birch Mix --   +/-   30. Beech American 2+    31. Cottonwood, Guinea-Bissau 2+    32. Hickory, White 2+    33. Maple Mix 3+    34. Oak, Guinea-[place]issau Mix Negative    35. Sycamore Eastern Negative    36. Alternaria Alternata --   +/-   37. Cladosporium Herbarum --   +/-   38. Aspergillus Mix 2+    39. Penicillium Mix Negative    40. Bipolaris Sorokiniana (Helminthosporium) 2+    41. Drechslera Spicifera (Curvularia) 2+    42. Mucor Plumbeus 2+    43. Fusarium Moniliforme 2+    44. Aureobasidium Pullulans (pullulara) --   +/-   45. Rhizopus Oryzae Negative    46. Botrytis Cinera Negative    47. Epicoccum Nigrum 2+    48. Phoma Betae Negative    49. Dust Mite Mix Negative    50. Cat Hair 10,000 BAU/ml Negative    51.  Dog Epithelia Negative    52. Mixed Feathers Negative    53. Horse Epithelia Negative    54. Cockroach, German 2+    55. Tobacco Leaf Negative          Intradermal - 11/14/23 1045     Time Antigen Placed 1045    Allergen Manufacturer Greer    Location Arm    Number of Test 6    Control Negative    Bahia Negative    Mold 3 2+    Mite Mix Negative    Cat Negative    Dog Negative          Previous notes and tests were reviewed. The plan was reviewed with the patient/family, and all questions/concerned were addressed.  It was my pleasure to see Julie Pineda today and participate in her care. Please feel free to contact me with any questions or concerns.  Sincerely,  Julie Cramp, DO Allergy  & Immunology  [facility]  [place] office: [phone] [facility] office: [phone]

## 2023-11-14 ENCOUNTER — Ambulatory Visit (INDEPENDENT_AMBULATORY_CARE_PROVIDER_SITE_OTHER): Admitting: Allergy

## 2023-11-14 ENCOUNTER — Encounter: Payer: Self-pay | Admitting: Allergy

## 2023-11-14 DIAGNOSIS — J3089 Other allergic rhinitis: Secondary | ICD-10-CM

## 2023-11-14 NOTE — Patient Instructions (Addendum)
 Today's skin testing positive to grass, ragweed, weed, trees, mold, cockroach.  Results given.  Environmental allergies Start environmental control measures as below. Use over the counter antihistamines such as Zyrtec (cetirizine), Claritin (loratadine), Allegra (fexofenadine), or Xyzal (levocetirizine) daily as needed. May take twice a day during allergy  flares. May switch antihistamines every few months. Use Flonase (fluticasone) nasal spray 1-2 sprays per nostril once a day as needed for nasal congestion.  Nasal saline spray (i.e., Simply Saline) or nasal saline lavage (i.e., NeilMed) is recommended as needed and prior to medicated nasal sprays. Recommend allergy  injections. 2 shots.  Had a detailed discussion with patient/family that clinical history is suggestive of allergic rhinitis, and may benefit from allergy  immunotherapy (AIT). Discussed in detail regarding the dosing, schedule, side effects (mild to moderate local allergic reaction and rarely systemic allergic reactions including anaphylaxis), and benefits (significant improvement in nasal symptoms, seasonal flares of asthma) of immunotherapy with the patient. There is significant time commitment involved with allergy  shots, which includes weekly immunotherapy injections for first 9-12 months and then biweekly to monthly injections for 3-5 years.   Return in about 3 months (around 02/14/2024). Or sooner if needed.   Reducing Pollen Exposure Pollen seasons: trees (spring), grass (summer) and ragweed/weeds (fall). Keep windows closed in your home and car to lower pollen exposure.  Install air conditioning in the bedroom and throughout the house if possible.  Avoid going out in dry windy days - especially early morning. Pollen counts are highest between 5 - 10 AM and on dry, hot and windy days.  Save outside activities for late afternoon or after a heavy rain, when pollen levels are lower.  Avoid mowing of grass if you have grass  pollen allergy . Be aware that pollen can also be transported indoors on people and pets.  Dry your clothes in an automatic dryer rather than hanging them outside where they might collect pollen.  Rinse hair and eyes before bedtime. Mold Control Mold and fungi can grow on a variety of surfaces provided certain temperature and moisture conditions exist.  Outdoor molds grow on plants, decaying vegetation and soil. The major outdoor mold, Alternaria and Cladosporium, are found in very high numbers during hot and dry conditions. Generally, a late summer - fall peak is seen for common outdoor fungal spores. Rain will temporarily lower outdoor mold spore count, but counts rise rapidly when the rainy period ends. The most important indoor molds are Aspergillus and Penicillium. Dark, humid and poorly ventilated basements are ideal sites for mold growth. The next most common sites of mold growth are the bathroom and the kitchen. Outdoor (Seasonal) Mold Control Use air conditioning and keep windows closed. Avoid exposure to decaying vegetation. Avoid leaf raking. Avoid grain handling. Consider wearing a face mask if working in moldy areas.  Indoor (Perennial) Mold Control  Maintain humidity below 50%. Get rid of mold growth on hard surfaces with water, detergent and, if necessary, 5% bleach (do not mix with other cleaners). Then dry the area completely. If mold covers an area more than 10 square feet, consider hiring an indoor environmental professional. For clothing, washing with soap and water is best. If moldy items cannot be cleaned and dried, throw them away. Remove sources e.g. contaminated carpets. Repair and seal leaking roofs or pipes. Using dehumidifiers in damp basements may be helpful, but empty the water and clean units regularly to prevent mildew from forming. All rooms, especially basements, bathrooms and kitchens, require ventilation and cleaning to deter mold  and mildew growth. Avoid  carpeting on concrete or damp floors, and storing items in damp areas. Cockroach Allergen Avoidance Cockroaches are often found in the homes of densely populated urban areas, schools or commercial buildings, but these creatures can lurk almost anywhere. This does not mean that you have a dirty house or living area. Block all areas where roaches can enter the home. This includes crevices, wall cracks and windows.  Cockroaches need water to survive, so fix and seal all leaky faucets and pipes. Have an exterminator go through the house when your family and pets are gone to eliminate any remaining roaches. Keep food in lidded containers and put pet food dishes away after your pets are done eating. Vacuum and sweep the floor after meals, and take out garbage and recyclables. Use lidded garbage containers in the kitchen. Wash dishes immediately after use and clean under stoves, refrigerators or toasters where crumbs can accumulate. Wipe off the stove and other kitchen surfaces and cupboards regularly.   Pet Allergen Avoidance: Contrary to popular opinion, there are no "hypoallergenic" breeds of dogs or cats. That is because people are not allergic to an animal's hair, but to an allergen found in the animal's saliva, dander (dead skin flakes) or urine. Pet allergy  symptoms typically occur within minutes. For some people, symptoms can build up and become most severe 8 to 12 hours after contact with the animal. People with severe allergies can experience reactions in public places if dander has been transported on the pet owners' clothing. Keeping an animal outdoors is only a partial solution, since homes with pets in the yard still have higher concentrations of animal allergens. Before getting a pet, ask your allergist to determine if you are allergic to animals. If your pet is already considered part of your family, try to minimize contact and keep the pet out of the bedroom and other rooms where you spend a  great deal of time. As with dust mites, vacuum carpets often or replace carpet with a hardwood floor, tile or linoleum. High-efficiency particulate air (HEPA) cleaners can reduce allergen levels over time. While dander and saliva are the source of cat and dog allergens, urine is the source of allergens from rabbits, hamsters, mice and [place] pigs; so ask a non-allergic family member to clean the animal's cage. If you have a pet allergy , talk to your allergist about the potential for allergy  immunotherapy (allergy  shots). This strategy can often provide long-term relief.

## 2024-02-15 ENCOUNTER — Ambulatory Visit: Admitting: Allergy
